# Patient Record
Sex: Female | Born: 1995 | Race: White | Hispanic: No | Marital: Single | Smoking: Light tobacco smoker
Health system: Southern US, Community
[De-identification: ages and names within clinical notes are randomized; demographics above are authoritative.]

---

## 2018-04-06 ENCOUNTER — Emergency Department: Payer: Managed Care, Other (non HMO)

## 2018-04-06 ENCOUNTER — Emergency Department
Admission: EM | Admit: 2018-04-06 | Discharge: 2018-04-06 | Disposition: A | Discharge status: Home / Self Care | Payer: Managed Care, Other (non HMO) | Attending: Emergency Medicine | Admitting: Emergency Medicine

## 2018-04-06 ENCOUNTER — Encounter: Payer: Self-pay | Admitting: Emergency Medicine

## 2018-04-06 ENCOUNTER — Other Ambulatory Visit: Payer: Self-pay

## 2018-04-06 DIAGNOSIS — S93492A Sprain of other ligament of left ankle, initial encounter: Secondary | ICD-10-CM

## 2018-04-06 DIAGNOSIS — S93602A Unspecified sprain of left foot, initial encounter: Secondary | ICD-10-CM | POA: Diagnosis not present

## 2018-04-06 DIAGNOSIS — Y929 Unspecified place or not applicable: Secondary | ICD-10-CM | POA: Diagnosis not present

## 2018-04-06 DIAGNOSIS — Y999 Unspecified external cause status: Secondary | ICD-10-CM | POA: Insufficient documentation

## 2018-04-06 DIAGNOSIS — F17210 Nicotine dependence, cigarettes, uncomplicated: Secondary | ICD-10-CM | POA: Diagnosis not present

## 2018-04-06 DIAGNOSIS — Y9339 Activity, other involving climbing, rappelling and jumping off: Secondary | ICD-10-CM | POA: Diagnosis not present

## 2018-04-06 DIAGNOSIS — S99922A Unspecified injury of left foot, initial encounter: Secondary | ICD-10-CM | POA: Diagnosis present

## 2018-04-06 DIAGNOSIS — X500XXA Overexertion from strenuous movement or load, initial encounter: Secondary | ICD-10-CM | POA: Diagnosis not present

## 2018-04-06 MED ORDER — IBUPROFEN 600 MG PO TABS: 600.0000 mg | ORAL_TABLET | Freq: Four times a day (QID) | ORAL | 0 refills | Status: AC | PRN

## 2018-04-06 NOTE — Discharge Instructions (Addendum)
Please rest ice and elevate the foot and ankle.  Please use crutches as needed for ambulation for 2 to 3 days or until you are able to ambulate without a limp.  Please alternate 600 mg of ibuprofen every 6 hours along with 1000 mg of Tylenol every 6 hours.  Please purchase over-the-counter ankle stirrup lace up brace.  Follow-up with orthopedics if continued foot and ankle pain in 1 week with no improvement.

## 2018-04-06 NOTE — ED Triage Notes (Signed)
Pt states that she was bouncing in a bouncy house and landed on her left foot painfully around 30 minutes ago. Pt is ambulatory to triage and in NAD.

## 2018-04-06 NOTE — ED Provider Notes (Signed)
[facility] EMERGENCY DEPARTMENT Provider Note   CSN: [id number] Arrival date & time: 04/06/18  1958     History   Chief Complaint Chief Complaint  Patient presents with  . Foot Pain    HPI Elizabeth Ramos is a 22 y.o. female.  Presents to the emergency department for evaluation of left foot pain.  She has pain along the tarsal region of her left foot with swelling.  Patient states just prior to arrival she was jumping on a bouncy house when she twisted her foot and felt a pop.  She has had pain and swelling to the foot.  She is been able to bear weight but limping.  Pain is 5 out of 10.  She has not had any medications for pain.  She denies any other injury to her body.  Not having any ankle or tib-fib pain.  She denies any discomfort.  HPI  History reviewed. No pertinent past medical history.  There are no active problems to display for this patient.   History reviewed. No pertinent surgical history.   OB History   None      Home Medications    Prior to Admission medications   Medication Sig Start Date End Date Taking? Authorizing Provider  ibuprofen (ADVIL,MOTRIN) 600 MG tablet Take 1 tablet (600 mg total) by mouth every 6 (six) hours as needed for moderate pain. 04/06/18   Evon Slack, PA-C    Family History No family history on file.  Social History Social History   Tobacco Use  . Smoking status: Light Tobacco Smoker  . Smokeless tobacco: Never Used  Substance Use Topics  . Alcohol use: Yes    Frequency: Never  . Drug use: Never     Allergies   Penicillins   Review of Systems Review of Systems  Constitutional: Negative.   Cardiovascular: Negative for chest pain and leg swelling.  Musculoskeletal: Positive for gait problem and joint swelling. Negative for back pain and neck pain.  Skin: Negative for color change, rash and wound.  Neurological: Negative for dizziness, syncope and weakness.  All other systems reviewed and  are negative.    Physical Exam Updated Vital Signs BP 116/80   Pulse 88   Temp 98.8 F (37.1 C) (Oral)   Resp 18   Ht  (1.676 m)   Wt 56.7 kg (125 lb)   LMP 03/23/2018 (Approximate)   SpO2 100%   BMI 20.18 kg/m   Physical Exam  Constitutional: She is oriented to person, place, and time. She appears well-developed and well-nourished.  HENT:  Head: Normocephalic and atraumatic.  Eyes: Conjunctivae are normal.  Neck: Normal range of motion.  Cardiovascular: Normal rate.  Pulmonary/Chest: Effort normal. No respiratory distress.  Musculoskeletal: Normal range of motion.  Examination of the left foot shows swelling along the base of the fifth metatarsal.  There is mild ecchymosis.  Patient is tender along the fourth and fifth base of the metatarsal with mild tenderness along the ATFL ligament.  Ankle stable to stress testing.  She is nontender along the medial malleolus or lateral malleolus.  She is nontender throughout the tib-fib region has never negative a negative syndesmotic squeeze test.  She is nontender throughout the calcaneus and is able to plantarflex and dorsiflex well.  Neurological: She is alert and oriented to person, place, and time.  Skin: Skin is warm. No rash noted.  Psychiatric: She has a normal mood and affect. Her behavior is normal. Thought  content normal.     ED Treatments / Results  Labs (all labs ordered are listed, but only abnormal results are displayed) Labs Reviewed - No data to display  EKG None  Radiology Dg Foot Complete Left  Result Date: 04/06/2018 CLINICAL DATA:  Swelling at base of 5th metatarsal EXAM: LEFT FOOT - COMPLETE 3+ VIEW COMPARISON:  None. FINDINGS: No fracture or dislocation is seen. The joint spaces are preserved. Mild lateral soft tissue swelling. IMPRESSION: No fracture or dislocation is seen. Mild lateral soft tissue swelling. Electronically Signed   By: Charline Bills M.D.   On: 04/06/2018 20:36     Procedures Procedures (including critical care time)  Medications Ordered in ED Medications - No data to display   Initial Impression / Assessment and Plan / ED Course  I have reviewed the triage vital signs and the nursing notes.  Pertinent labs & imaging results that were available during my care of the patient were reviewed by me and considered in my medical decision making (see chart for details).     22 year old female with pain and swelling throughout the fourth and fifth metatarsal region with tenderness over the left ATFL ligament.  Ankle stable.  X-rays ordered and reviewed by me show no evidence of acute bony abnormality of the left foot.  She had no bony tenderness along the medial or lateral malleolus, x-rays of the ankle not ordered.  Patient is ambulatory with no assistive devices with minimal limp.  Encourage patient to use crutches and to rest ice and elevate the lower extremity.  Patient will use Cerumenex crutches.  I also encouraged her to purchase over-the-counter ASO brace in which she is going to purchase today.  Ace wrap was applied today for swelling.  Patient will alternate Tylenol and ibuprofen as needed for pain.  Follow-up with orthopedics if no improvement in 1 week.  Final Clinical Impressions(s) / ED Diagnoses   Final diagnoses:  Sprain of left foot, initial encounter  Sprain of anterior talofibular ligament of left ankle, initial encounter    ED Discharge Orders        Ordered    ibuprofen (ADVIL,MOTRIN) 600 MG tablet  Every 6 hours PRN     04/06/18 2110       Ronnette Juniper 04/06/18 2122    Rockne Menghini, MD 04/07/18 0006

## 2019-06-30 IMAGING — CR DG FOOT COMPLETE 3+V*L*
1 series · 3 of 3 positions shown · non-contrast
Comparison: None.

CLINICAL DATA: Swelling at base of 5th metatarsal

EXAM:
LEFT FOOT - COMPLETE 3+ VIEW

[Series 1: dg foot complete left · 0.14mm/px · 3 of 3 slices shown]
[im 1/3]
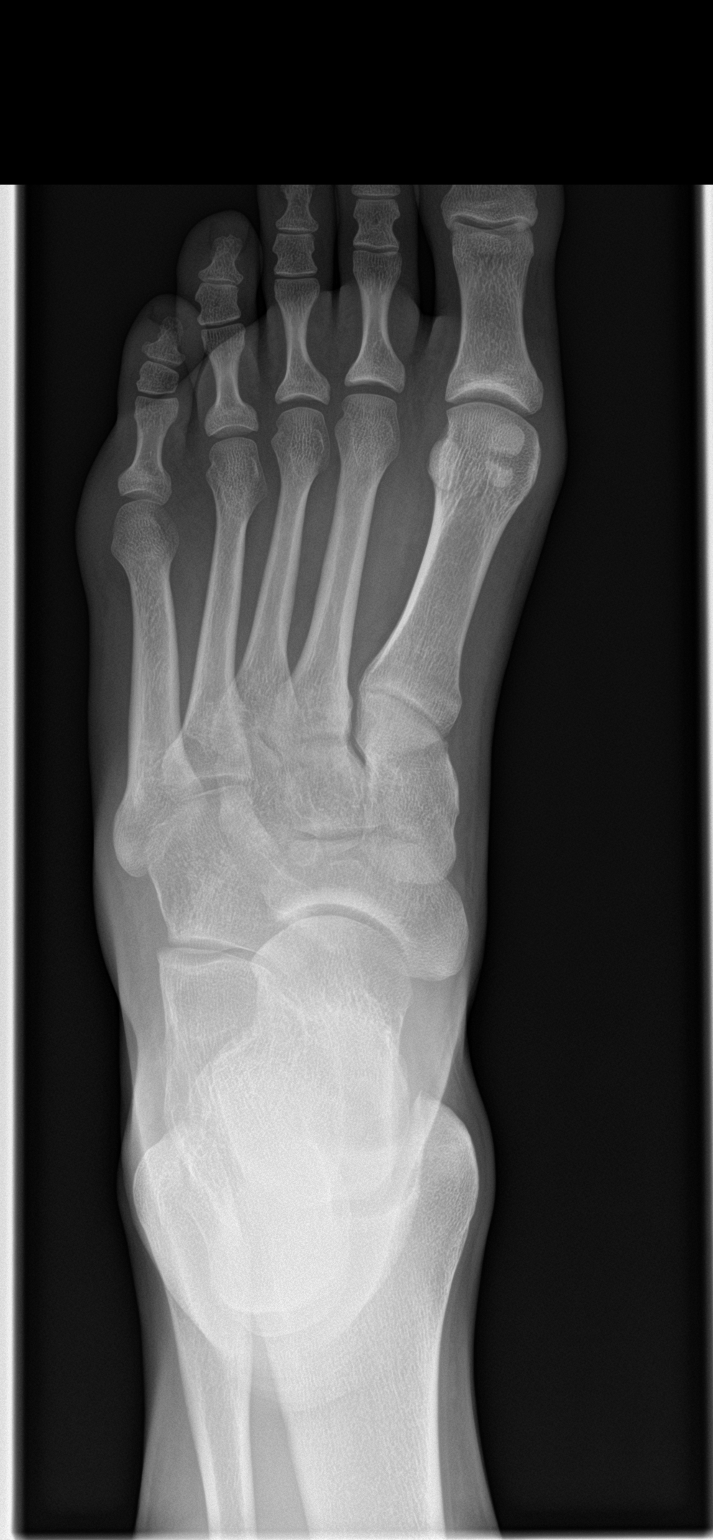
[im 2/3]
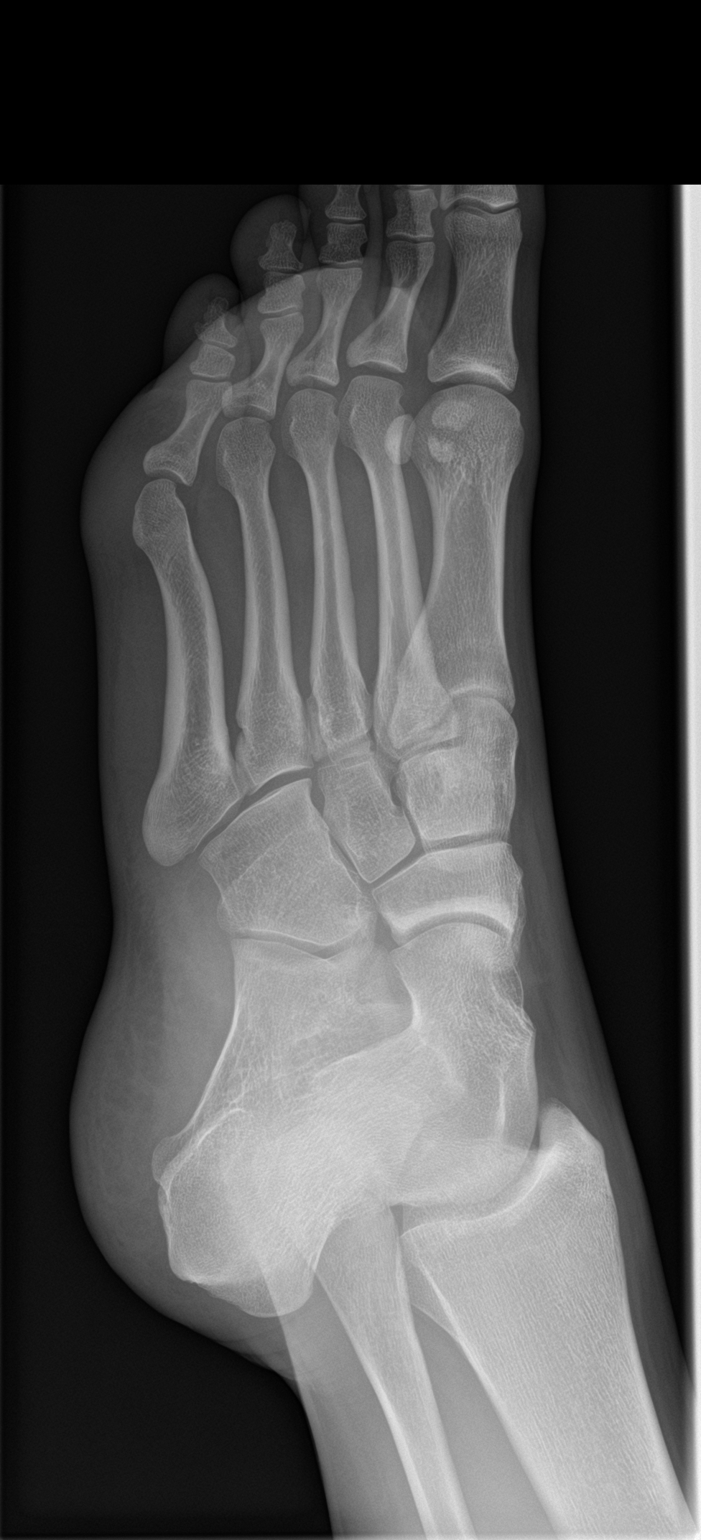
[im 3/3]
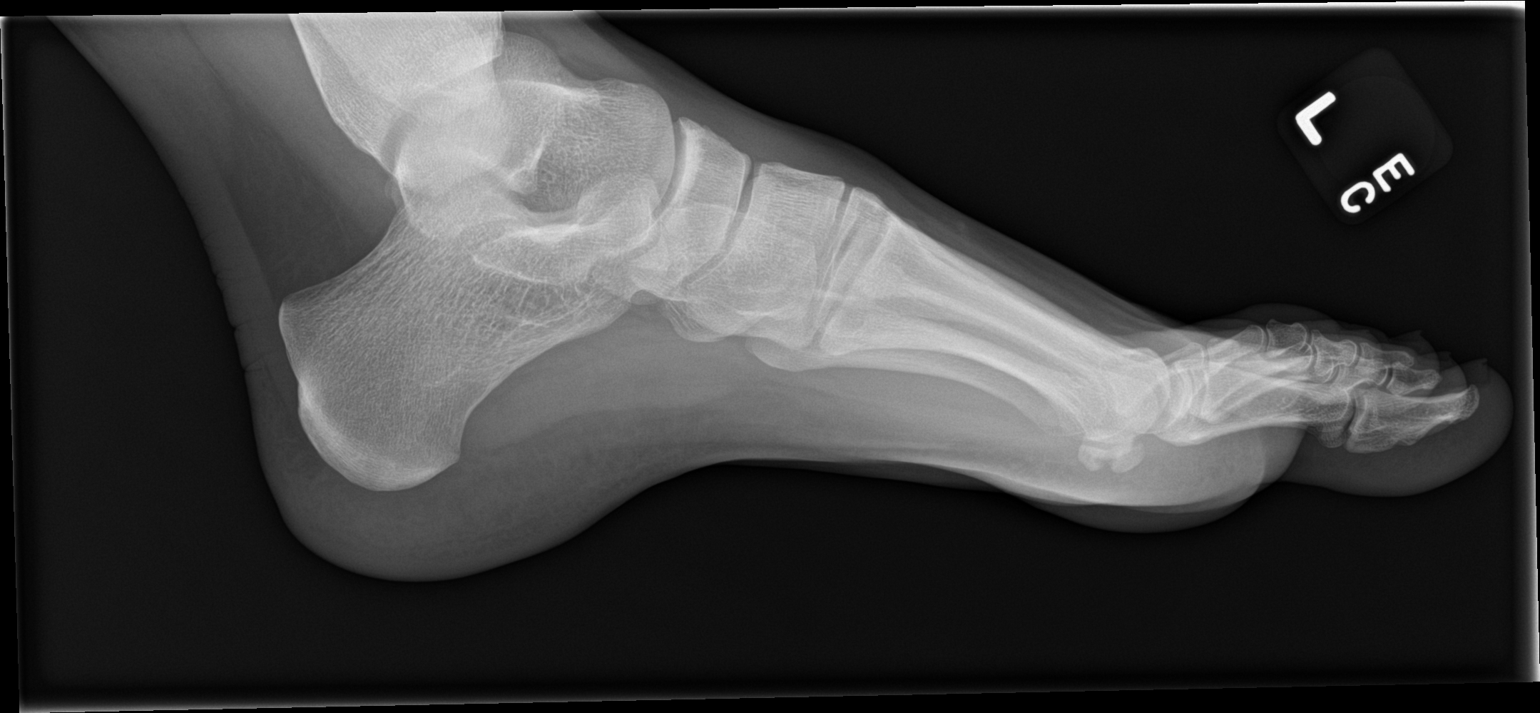

[3 of 3 positions shown; findings below may reference images not displayed]

FINDINGS: No fracture or dislocation is seen.

The joint spaces are preserved.

Mild lateral soft tissue swelling.
IMPRESSION: No fracture or dislocation is seen.

Mild lateral soft tissue swelling.
